# Patient Record
Sex: Male | Born: 1985 | Race: White | Hispanic: Yes | Marital: Married | Smoking: Never smoker
Health system: Southern US, Community
[De-identification: ages and names within clinical notes are randomized; demographics above are authoritative.]

---

## 2007-06-26 ENCOUNTER — Emergency Department (HOSPITAL_COMMUNITY): Admission: EM | Admit: 2007-06-26 | Discharge: 2007-06-26 | Payer: Self-pay | Admitting: Emergency Medicine

## 2010-10-19 LAB — URINALYSIS, ROUTINE W REFLEX MICROSCOPIC
Nitrite: NEGATIVE
Protein, ur: NEGATIVE
Specific Gravity, Urine: 1.02
Urobilinogen, UA: 1

## 2012-09-20 ENCOUNTER — Emergency Department (HOSPITAL_COMMUNITY): Payer: No Typology Code available for payment source

## 2012-09-20 ENCOUNTER — Emergency Department (HOSPITAL_COMMUNITY)
Admission: EM | Admit: 2012-09-20 | Discharge: 2012-09-20 | Disposition: A | Discharge status: Home / Self Care | Payer: No Typology Code available for payment source | Attending: Emergency Medicine | Admitting: Emergency Medicine

## 2012-09-20 ENCOUNTER — Encounter (HOSPITAL_COMMUNITY): Payer: Self-pay | Admitting: Emergency Medicine

## 2012-09-20 DIAGNOSIS — Y92410 Unspecified street and highway as the place of occurrence of the external cause: Secondary | ICD-10-CM | POA: Insufficient documentation

## 2012-09-20 DIAGNOSIS — Y9389 Activity, other specified: Secondary | ICD-10-CM | POA: Insufficient documentation

## 2012-09-20 DIAGNOSIS — S298XXA Other specified injuries of thorax, initial encounter: Secondary | ICD-10-CM | POA: Insufficient documentation

## 2012-09-20 MED ORDER — IBUPROFEN 400 MG PO TABS
800.0000 mg | ORAL_TABLET | Freq: Once | ORAL | Status: AC
  Administered 2012-09-20: 800 mg via ORAL
  Filled 2012-09-20: qty 2

## 2012-09-20 MED ORDER — METHOCARBAMOL 500 MG PO TABS: 500.0000 mg | ORAL_TABLET | Freq: Two times a day (BID) | ORAL | Status: AC

## 2012-09-20 MED ORDER — METHOCARBAMOL 500 MG PO TABS
500.0000 mg | ORAL_TABLET | Freq: Once | ORAL | Status: AC
  Administered 2012-09-20: 500 mg via ORAL
  Filled 2012-09-20: qty 1

## 2012-09-20 MED ORDER — IBUPROFEN 800 MG PO TABS: 800.0000 mg | ORAL_TABLET | Freq: Three times a day (TID) | ORAL | Status: AC
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
## 2012-09-20 NOTE — ED Provider Notes (Signed)
Medical screening examination/treatment/procedure(s) were performed by non-physician practitioner and as supervising physician I was immediately available for consultation/collaboration.  Flint Melter, MD 09/20/12 [phone]
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
## 2017-06-17 ENCOUNTER — Encounter (HOSPITAL_COMMUNITY): Payer: Self-pay

## 2017-06-17 ENCOUNTER — Emergency Department (HOSPITAL_COMMUNITY): Payer: Self-pay

## 2017-06-17 ENCOUNTER — Emergency Department (HOSPITAL_COMMUNITY)
Admission: EM | Admit: 2017-06-17 | Discharge: 2017-06-17 | Disposition: A | Discharge status: Home / Self Care | Payer: Self-pay | Attending: Emergency Medicine | Admitting: Emergency Medicine

## 2017-06-17 DIAGNOSIS — R208 Other disturbances of skin sensation: Secondary | ICD-10-CM | POA: Insufficient documentation

## 2017-06-17 DIAGNOSIS — R6889 Other general symptoms and signs: Secondary | ICD-10-CM

## 2017-06-17 DIAGNOSIS — K649 Unspecified hemorrhoids: Secondary | ICD-10-CM | POA: Insufficient documentation

## 2017-06-17 LAB — URINALYSIS, ROUTINE W REFLEX MICROSCOPIC
BILIRUBIN URINE: NEGATIVE
Glucose, UA: NEGATIVE mg/dL
HGB URINE DIPSTICK: NEGATIVE
KETONES UR: NEGATIVE mg/dL
Leukocytes, UA: NEGATIVE
NITRITE: NEGATIVE
PH: 5 (ref 5.0–8.0)
Protein, ur: NEGATIVE mg/dL
Specific Gravity, Urine: 1.026 (ref 1.005–1.030)

## 2017-06-17 LAB — CBC WITH DIFFERENTIAL/PLATELET
BASOS ABS: 0.1 10*3/uL (ref 0.0–0.1)
Basophils Relative: 1 %
EOS PCT: 1 %
Eosinophils Absolute: 0.1 10*3/uL (ref 0.0–0.7)
HCT: 45.3 % (ref 39.0–52.0)
Hemoglobin: 15 g/dL (ref 13.0–17.0)
LYMPHS PCT: 51 %
Lymphs Abs: 4 10*3/uL (ref 0.7–4.0)
MCH: 27.9 pg (ref 26.0–34.0)
MCHC: 33.1 g/dL (ref 30.0–36.0)
MCV: 84.4 fL (ref 78.0–100.0)
MONO ABS: 0.5 10*3/uL (ref 0.1–1.0)
MONOS PCT: 6 %
NEUTROS PCT: 41 %
Neutro Abs: 3.3 10*3/uL (ref 1.7–7.7)
PLATELETS: 269 10*3/uL (ref 150–400)
RBC: 5.37 MIL/uL (ref 4.22–5.81)
RDW: 13.2 % (ref 11.5–15.5)
WBC: 8 10*3/uL (ref 4.0–10.5)

## 2017-06-17 LAB — COMPREHENSIVE METABOLIC PANEL
ALBUMIN: 3.6 g/dL (ref 3.5–5.0)
ALT: 86 U/L — ABNORMAL HIGH (ref 17–63)
ANION GAP: 6 (ref 5–15)
AST: 44 U/L — ABNORMAL HIGH (ref 15–41)
Alkaline Phosphatase: 141 U/L — ABNORMAL HIGH (ref 38–126)
BUN: 7 mg/dL (ref 6–20)
CHLORIDE: 107 mmol/L (ref 101–111)
CO2: 25 mmol/L (ref 22–32)
Calcium: 9 mg/dL (ref 8.9–10.3)
Creatinine, Ser: 0.82 mg/dL (ref 0.61–1.24)
GFR calc non Af Amer: >60 mL/min (ref >60)
GLUCOSE: 108 mg/dL — AB (ref 65–99)
POTASSIUM: 4.3 mmol/L (ref 3.5–5.1)
Sodium: 138 mmol/L (ref 135–145)
Total Bilirubin: 0.6 mg/dL (ref 0.3–1.2)
Total Protein: 6.9 g/dL (ref 6.5–8.1)

## 2017-06-17 LAB — CK: Total CK: 48 U/L — ABNORMAL LOW (ref 49–397)

## 2017-06-17 NOTE — ED Provider Notes (Addendum)
Patient placed in Quick Look pathway, seen and evaluated   Chief Complaint: subjective fever, bodyaches  HPI:   Patient presents today history of tactile fever and generalized body aches.  Also reports decrease in appetite.  He has been taking antipyretics with improvement in symptoms.  Last antipyretic use was 1 hour prior to arrival.  Denies any cough, congestion, vomiting, abdominal pain, dysuria, back pain, headache, sick contacts with similar symptoms, recent travel, tick bites.  ROS: fever  Physical Exam:   Gen: No distress  Neuro: Awake and Alert  Skin: Warm    Focused Exam: RRR, lungs CTAB. No abdominal TTP. No CVA tenderness.   Initiation of care has begun. The patient has been counseled on the process, plan, and necessity for staying for the completion/evaluation, and the remainder of the medical screening examination    Dietrich Pates, PA-C 06/17/17 1749    Rolland Porter, MD 06/27/17 [phone]

## 2017-06-17 NOTE — ED Notes (Signed)
Patient states to me that he didn't have fever.  He as hemorrhoids but didn't tell the triage nurse.  Patient denies fever at home.

## 2017-06-17 NOTE — ED Provider Notes (Signed)
[facility] EMER[facility]ENC[facility] DEPARTMENT Provider Note   CSN: [id number] Arrival date & time: 06/17/17  1617     History   Chief Complaint Chief Complaint  Patient presents with  . Fever  . Hemorrhoids    HPI Reginald Chung is a 32 y.o. male with no significant past medical history who presents today for evaluation of tactile fevers, generalized body aches.  He also reported to detect that he has hemorrhoids but that he did not have a fever at home.  Patient reports that he has had hemorrhoids for the past 3 to 4 weeks and is concerned because they are now starting to bleed.  He has not gotten any medical attention for these or had them in the past.  He denies any recent constipation, diarrhea, or abdominal pain.  He denies dysuria, hematuria, cough, sore throat, ear pain, or recent sick contacts.  He reports that he did get a flu shot.  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  It has been exceptionally warm the past 2 days, and he reports that he does not think he should be drinking as much water as he needs to.  He reports that he is not urinating often and when he does it is yellow.  He reports generally feeling hot and that when he feels hot his body hurts.  He currently denies feeling hot right now.    HPI  History reviewed. No pertinent past medical history.  There are no active problems to display for this patient.   History reviewed. No pertinent surgical history.      Home Medications    Prior to Admission medications   Medication Sig Start Date End Date Taking? Authorizing Provider  ibuprofen (ADVIL,MOTRIN) 800 MG tablet Take 1 tablet (800 mg total) by mouth 3 (three) times daily. 09/20/12   Domenic Moras, PA-C  methocarbamol (ROBAXIN) 500 MG tablet Take 1 tablet (500 mg total) by mouth 2 (two) times daily. 09/20/12   Domenic Moras, PA-C    Family History No family history on file.  Social History Social History    Tobacco Use  . Smoking status: Never Smoker  Substance Use Topics  . Alcohol use: Yes  . Drug use: No     Allergies   Patient has no known allergies.   Review of Systems Review of Systems  Constitutional: Positive for fever (Tactile). Negative for chills.  HENT: Negative for congestion, ear pain, rhinorrhea, sinus pressure, sinus pain, sore throat and trouble swallowing.   Eyes: Negative for pain and visual disturbance.  Respiratory: Negative for cough, chest tightness and shortness of breath.   Cardiovascular: Negative for chest pain and palpitations.  Gastrointestinal: Positive for anal bleeding (Hemorrhoids). Negative for abdominal pain and vomiting.  Genitourinary: Negative for dysuria and hematuria.       Decreased urination.   Musculoskeletal: Positive for myalgias. Negative for arthralgias and back pain.  Skin: Negative for color change and rash.  Neurological: Negative for seizures, syncope and headaches.  All other systems reviewed and are negative.    Physical Exam Updated Vital Signs BP 121/78 (BP Location: Right Arm)   Pulse 70   Temp 98.4 F (36.9 C) (Oral)   Resp 16   SpO2 100%   Physical Exam  Constitutional: He is oriented to person, place, and time. He appears well-developed and well-nourished. No distress.  HENT:  Head: Normocephalic and atraumatic.  Nose: Nose normal.  Mouth/Throat: Oropharynx is  clear and moist.  Eyes: Conjunctivae are normal. Right eye exhibits no discharge. Left eye exhibits no discharge. No scleral icterus.  Neck: Normal range of motion. Neck supple.  Cardiovascular: Normal rate, regular rhythm, normal heart sounds and intact distal pulses.  Pulmonary/Chest: Effort normal and breath sounds normal. No stridor. No respiratory distress.  Abdominal: Soft. Bowel sounds are normal. He exhibits no distension. There is no tenderness. There is no guarding.  Genitourinary:  Genitourinary Comments: Exam performed with chaperone in  room of external rectum revealing 2 hemorrhoids, they are non-thrombosed, not actively bleeding, DRE was not performed.  Musculoskeletal: He exhibits no edema or deformity.  No TTP over large muscle groups.   Lymphadenopathy:    He has no cervical adenopathy.  Neurological: He is alert and oriented to person, place, and time. He exhibits normal muscle tone.  Skin: Skin is warm and dry. He is not diaphoretic.  Psychiatric: He has a normal mood and affect. His behavior is normal.  Nursing note and vitals reviewed.    ED Treatments / Results  Labs (all labs ordered are listed, but only abnormal results are displayed) Labs Reviewed  COMPREHENSIVE METABOLIC PANEL - Abnormal; Notable for the following components:      Result Value   Glucose, Bld 108 (*)    AST 44 (*)    ALT 86 (*)    Alkaline Phosphatase 141 (*)    All other components within normal limits  CK - Abnormal; Notable for the following components:   Total CK 48 (*)    All other components within normal limits  CBC WITH DIFFERENTIAL/PLATELET  URINALYSIS, ROUTINE W REFLEX MICROSCOPIC  HEPATITIS PANEL, ACUTE    EKG None  Radiology Dg Chest 2 View  Result Date: 06/17/2017 CLINICAL DATA:  Fevers, body aches EXAM: CHEST - 2 VIEW COMPARISON:  09/20/2012 FINDINGS: The heart size and mediastinal contours are within normal limits. Both lungs are clear. The visualized skeletal structures are unremarkable. IMPRESSION: No active cardiopulmonary disease. Electronically Signed   By: Kathreen Devoid   On: 06/17/2017 17:05    Procedures Procedures (including critical care time)  Medications Ordered in ED Medications - No data to display   Initial Impression / Assessment and Plan / ED Course  I have reviewed the triage vital signs and the nursing notes.  Pertinent labs & imaging results that were available during my care of the patient were reviewed by me and considered in my medical decision making (see chart for details).     Patient presents today for evaluation of the sensation of feeling hot.  He reports that this is been going on for 2 days and is associated with generalized muscle aches.  Patient is afebrile here, not tachycardic, with normal vitals.  He does report decreased urine output, as it is hot and he works outside, concerns for dehydration.  Given this labs were obtained, CBC without significant abnormalities, CMP with elevated alk phos, ALT, AST.  Acute hepatitis panel was sent, patient informed that he needs to follow-up with [place] on my chart and states his understanding.  He does not have any abdominal pain or tenderness to palpation, therefore I am not suspicious for cholecystitis, biliary stones, or cholangitis.  CK was not elevated.  Chest x-ray was obtained prior to my evaluation which was negative for acute processes.  Regarding his hemorrhoids he has 2 obvious external hemorrhoids without obvious bleeding, they are not thrombosed.  Preparation H and MiraLAX recommended, along with basic conservative care.  PCP follow-up.  Patient was given strict return precautions, states his understanding.   Final Clinical Impressions(s) / ED Diagnoses   Final diagnoses:  Hemorrhoids, unspecified hemorrhoid type  Sensation of feeling hot    ED Discharge Orders    None       Ollen Gross 06/17/17 2137    Blanchie Dessert, MD 06/17/17 [phone]

## 2017-06-17 NOTE — ED Notes (Signed)
Added on hepatitis panel to previous collection.

## 2017-06-17 NOTE — Discharge Instructions (Signed)
Please get a thermometer to take your temperature.  If you continue to have fevers then please seek additional medical care.  If you develop pain anywhere you need to be seen again.  Today your liver enzymes were slightly elevated, this can be caused by any number of conditions.  Please avoid alcohol, and if you develop pain on the right upper area of your belly please return to the emergency room.  Please do not take any Tylenol, you may take ibuprofen every 8 hours for your pain as needed.  Please make sure that you are staying well-hydrated.  I have sent additional blood work called an acute hepatitis panel, this is to evaluate for infections and other causes of liver irritation.  Please follow-up on these results in my chart.  Please make sure that you are staying well-hydrated.  Please get MiraLAX from the drugstore.  Please take 1 capful every day, this will help soften your bowel movements and relieve your hemorrhoids.  You may also get Preparation H from the drugstore to help with your hemorrhoids.

## 2017-06-17 NOTE — ED Notes (Signed)
Witnesses rectal exam performed by [facility]

## 2017-06-17 NOTE — ED Triage Notes (Signed)
Pt presents for evaluation of tactile fever and generalized body aches x 2 days. Pt denies other symptoms. Pt reports took motrin 1 hour ago for body aches. Afebrile on arrival.

## 2019-01-05 ENCOUNTER — Other Ambulatory Visit: Payer: Self-pay

## 2019-01-05 ENCOUNTER — Emergency Department (HOSPITAL_COMMUNITY)
Admission: EM | Admit: 2019-01-05 | Discharge: 2019-01-06 | Discharge status: Against Medical Advice | Payer: No Typology Code available for payment source | Attending: Emergency Medicine | Admitting: Emergency Medicine

## 2019-01-05 ENCOUNTER — Encounter (HOSPITAL_COMMUNITY): Payer: Self-pay | Admitting: Emergency Medicine

## 2019-01-05 DIAGNOSIS — Z20822 Contact with and (suspected) exposure to covid-19: Secondary | ICD-10-CM

## 2019-01-05 DIAGNOSIS — J029 Acute pharyngitis, unspecified: Secondary | ICD-10-CM | POA: Diagnosis present

## 2019-01-05 DIAGNOSIS — Z20828 Contact with and (suspected) exposure to other viral communicable diseases: Secondary | ICD-10-CM | POA: Insufficient documentation

## 2019-01-05 LAB — GROUP A STREP BY PCR: Group A Strep by PCR: NOT DETECTED

## 2019-01-05 NOTE — ED Triage Notes (Signed)
Patient reports sore throat with mild swelling /hard to swallow onset 2 days ago , denies fever or cough , airway intact /respirations unlabored .

## 2019-01-06 LAB — SARS CORONAVIRUS 2 (TAT 6-24 HRS): SARS Coronavirus 2: NEGATIVE

## 2019-01-06 MED ORDER — DEXAMETHASONE SODIUM PHOSPHATE 10 MG/ML IJ SOLN
10.0000 mg | Freq: Once | INTRAMUSCULAR | Status: AC
  Administered 2019-01-06: 10 mg via INTRAMUSCULAR
  Filled 2019-01-06: qty 1

## 2019-01-06 MED ORDER — CLINDAMYCIN HCL 300 MG PO CAPS: 300.0000 mg | ORAL_CAPSULE | Freq: Three times a day (TID) | ORAL | 0 refills | Status: AC

## 2019-01-06 MED ORDER — CLINDAMYCIN HCL 150 MG PO CAPS
300.0000 mg | ORAL_CAPSULE | Freq: Once | ORAL | Status: AC
  Administered 2019-01-06: 300 mg via ORAL
  Filled 2019-01-06: qty 2

## 2019-01-06 MED ORDER — KETOROLAC TROMETHAMINE 30 MG/ML IJ SOLN
30.0000 mg | Freq: Once | INTRAMUSCULAR | Status: AC
  Administered 2019-01-06: 12:00:00 30 mg via INTRAMUSCULAR
  Filled 2019-01-06: qty 1

## 2019-01-06 NOTE — ED Notes (Signed)
No answer when taken [email or web address]

## 2019-01-06 NOTE — Discharge Instructions (Addendum)
Given an antibiotic and pain medicine while in the emergency department.  I will send you home with additional antibiotics.  Make sure to drink plenty of liquids.  I have obtained a Covid test however this does take 3 to 4 days to return.  You will be called if this is positive.  You will need to stay out of work until this returns.  If your sore throat does not improve over the next 24 hours please follow-up with ear nose and throat doctor.  Their phone number is listed on your discharge instructions.  You will need to call them to schedule an appointment.  If you have any difficulty swallowing liquids, become short of breath, unilateral neck pain or swelling please seek reevaluation emergency department

## 2019-01-06 NOTE — ED Notes (Signed)
Pt verbalized understanding of discharge instructions. Follow up care and prescriptions reviewed. Pt had no further questions at this time.

## 2019-01-06 NOTE — ED Provider Notes (Signed)
[facility]   CSN: [id number] Arrival date & time: 01/05/19  2310     History Chief Complaint  Patient presents with  . Sore Throat    Reginald Chung is a 33 y.o. male with no significant past medical history who presents for evaluation of sore throat.  Patient states he has had a sore throat since Sunday, 2 days PTA.  Located to bilateral sides of his throat.  States he has pain when he swallows however has been able to tolerate soft foods and liquids.  He did have a Covid positive coworker 1 week ago.  He denies fever, chills, nausea, vomiting, facial swelling, drooling, dysphagia, trismus, chest pain, shortness of breath, cough, abdominal pain, diarrhea, dysuria.  Patient denies prior history of strep infections or peritonsillar abscesses.  Denies additional aggravating or alleviating factors.  Rates his pain a 6/10.  History obtained from patient and past medical records.  No interpreter is used.  HPI     History reviewed. No pertinent past medical history.  There are no problems to display for this patient.   History reviewed. No pertinent surgical history.     No family history on file.  Social History   Tobacco Use  . Smoking status: Never Smoker  . Smokeless tobacco: Never Used  Substance Use Topics  . Alcohol use: Yes  . Drug use: No    Home Medications Prior to Admission medications   Medication Sig Start Date End Date Taking? Authorizing Provider  ibuprofen (ADVIL) 200 MG tablet Take 400 mg by mouth every 6 (six) hours as needed for mild pain.   Yes [provider]  ibuprofen (ADVIL,MOTRIN) 800 MG tablet Take 1 tablet (800 mg total) by mouth 3 (three) times daily. Patient not taking: Reported on 01/06/2019 09/20/12   Fayrene Helperran, [place], PA-C  methocarbamol (ROBAXIN) 500 MG tablet Take 1 tablet (500 mg total) by mouth 2 (two) times daily. Patient not taking: Reported on 01/06/2019 09/20/12   Fayrene Helperran, [place],  PA-C    Allergies    Patient has no known allergies.  Review of Systems   Review of Systems  Constitutional: Negative.   HENT: Positive for sore throat. Negative for congestion, ear discharge, ear pain, facial swelling, mouth sores, nosebleeds, postnasal drip, rhinorrhea, sinus pressure, sinus pain and voice change. Trouble swallowing: Pain with swallowing.   Respiratory: Negative.   Cardiovascular: Negative.   Gastrointestinal: Negative.   Genitourinary: Negative.   Musculoskeletal: Negative.   Skin: Negative.   Neurological: Negative.   All other systems reviewed and are negative.   Physical Exam Updated Vital Signs BP 98/88 (BP Location: Right Arm)   Pulse 79   Temp 98.3 F (36.8 C) (Oral)   Resp 16   SpO2 99%   Physical Exam Vitals and nursing note reviewed.  Constitutional:      General: He is not in acute distress.    Appearance: He is well-developed. He is not ill-appearing, toxic-appearing or diaphoretic.  HENT:     Head: Normocephalic and atraumatic.     Jaw: There is normal jaw occlusion.     Right Ear: Tympanic membrane, ear canal and external ear normal. No drainage, swelling or tenderness. Tympanic membrane is not injected, scarred, perforated, erythematous, retracted or bulging.     Left Ear: Tympanic membrane, ear canal and external ear normal. No drainage, swelling or tenderness. Tympanic membrane is not injected, scarred, perforated, erythematous, retracted or bulging.     Mouth/Throat:  Lips: Pink.     Mouth: Mucous membranes are moist.     Tongue: No lesions.     Pharynx: Oropharynx is clear. Uvula midline.     Tonsils: Tonsillar exudate present. No tonsillar abscesses. 2+ on the right. 2+ on the left.     Comments: Tonsils 2+ bilaterally with exudate.  Uvula midline without deviation.  Sublingual area soft.  No drooling, dysphagia or trismus.  No evidence of PTA or RPA.  Mucous membranes moist.  No oral lesions or lacerations.  Tongue midline.  No  facial swelling, submandibular swelling, no evidence of Ludwg's angina Eyes:     Pupils: Pupils are equal, round, and reactive to light.  Neck:     Trachea: Trachea and phonation normal.     Comments: Posterior cervical lymphadenopathy bilaterally.  No neck stiffness or neck rigidity.  No meningismus.  No obvious swelling. Cardiovascular:     Rate and Rhythm: Normal rate and regular rhythm.     Pulses: Normal pulses.     Heart sounds: Normal heart sounds.  Pulmonary:     Effort: Pulmonary effort is normal. No respiratory distress.     Breath sounds: Normal breath sounds and air entry.     Comments: Clear to auscultation bilateral without wheeze, rhonchi or rales.  Speaks in full sentences without difficulty. Abdominal:     General: There is no distension.     Palpations: Abdomen is soft.  Musculoskeletal:        General: Normal range of motion.     Cervical back: Full passive range of motion without pain, normal range of motion and neck supple.  Lymphadenopathy:     Cervical: Cervical adenopathy present.     Right cervical: Posterior cervical adenopathy present.     Left cervical: Posterior cervical adenopathy present.  Skin:    General: Skin is warm and dry.     Comments: No facial swelling, redness or warmth.  Neurological:     Mental Status: He is alert.     ED Results / Procedures / Treatments   Labs (all labs ordered are listed, but only abnormal results are displayed) Labs Reviewed  GROUP A STREP BY PCR  SARS CORONAVIRUS 2 (TAT 6-24 HRS)    EKG None  Radiology No results found.  Procedures Procedures (including critical care time)  Medications Ordered in ED Medications  ketorolac (TORADOL) 30 MG/ML injection 30 mg (30 mg Intramuscular Given 01/06/19 1145)  dexamethasone (DECADRON) injection 10 mg (10 mg Intramuscular Given 01/06/19 1147)  clindamycin (CLEOCIN) capsule 300 mg (300 mg Oral Given 01/06/19 1143)    ED Course  I have reviewed the triage vital  signs and the nursing notes.  Pertinent labs & imaging results that were available during my care of the patient were reviewed by me and considered in my medical decision making (see chart for details).  33 year old presents for evaluation of sore throat x2 days.  He is afebrile, nonseptic, not ill-appearing.  Did have Covid exposure at work.  No upper respiratory symptoms.  Lungs clear to auscultation.  Speaks in full sentences without difficulty.  No neck stiffness or neck rigidity.  He does have posterior cervical lymphadenopathy.  No drooling, dysphagia or trismus.  No facial swelling, no submandibular swelling, evidence of Ludwig's angina, facial cellulitis.  Patient with 2+ bilateral tonsils with exudate.  Uvula midline without deviation.  Sublingual area soft.  Tongue midline.  No evidence of PTA or RPA.  Painful to swallow however he  is able to tolerate soft foods and liquids without difficulty.  Strep test obtained from triage which was negative however I have high clinical suspicion patient has either strep pharyngitis or tonsillitis.  Will treat with NSAIDs, steroids for pain management.  Will also give antibiotics. Tolerating PO intake without difficulty. Patient to follow-up with ENT if his symptoms do not improve. Specific return precautions discussed.  Given he did have positive Covid exposure at work his employer is requesting he obtain testing prior to return to work.  Will obtain outpatient Covid test however given lack of additional symptoms I have low suspicion for this.  Discussed return precautions.  Patient voiced understanding and is agreeable for follow-up.    The patient has been appropriately medically screened and/or stabilized in the ED. I have low suspicion for any other emergent medical condition which would require further screening, evaluation or treatment in the ED or require inpatient management.  Patient is hemodynamically stable and in no acute distress.  Patient able to  ambulate in department prior to ED.  Evaluation does not show acute pathology that would require ongoing or additional emergent interventions while in the emergency department or further inpatient treatment.  I have discussed the diagnosis with the patient and answered all questions.  Pain is been managed while in the emergency department and patient has no further complaints prior to discharge.  Patient is comfortable with plan discussed in room and is stable for discharge at this time.  I have discussed strict return precautions for returning to the emergency department.  Patient was encouraged to follow-up with PCP/specialist refer to at discharge.   Reginald Chung was evaluated in Emergency Department on 01/06/2019 for the symptoms described in the history of present illness. He was evaluated in the context of the global COVID-19 pandemic, which necessitated consideration that the patient might be at risk for infection with the SARS-CoV-2 virus that causes COVID-19. Institutional protocols and algorithms that pertain to the evaluation of patients at risk for COVID-19 are in a state of rapid change based on information released by regulatory bodies including the CDC and federal and state organizations. These policies and algorithms were followed during the patient's care in the ED..     MDM Rules/Calculators/A&P                       Final Clinical Impression(s) / ED Diagnoses Final diagnoses:  Pharyngitis, unspecified etiology  COVID-19 virus test result unknown    Rx / DC Orders ED Discharge Orders    None       Jabrea Kallstrom A, PA-C 01/06/19 1239    Terrilee Files, MD 01/06/19 1718

## 2019-10-20 IMAGING — DX DG CHEST 2V
2 series · 2 of 2 positions shown · non-contrast
Comparison: 09/20/2012

CLINICAL DATA: Fevers, body aches

EXAM:
CHEST - 2 VIEW

[chest pa]
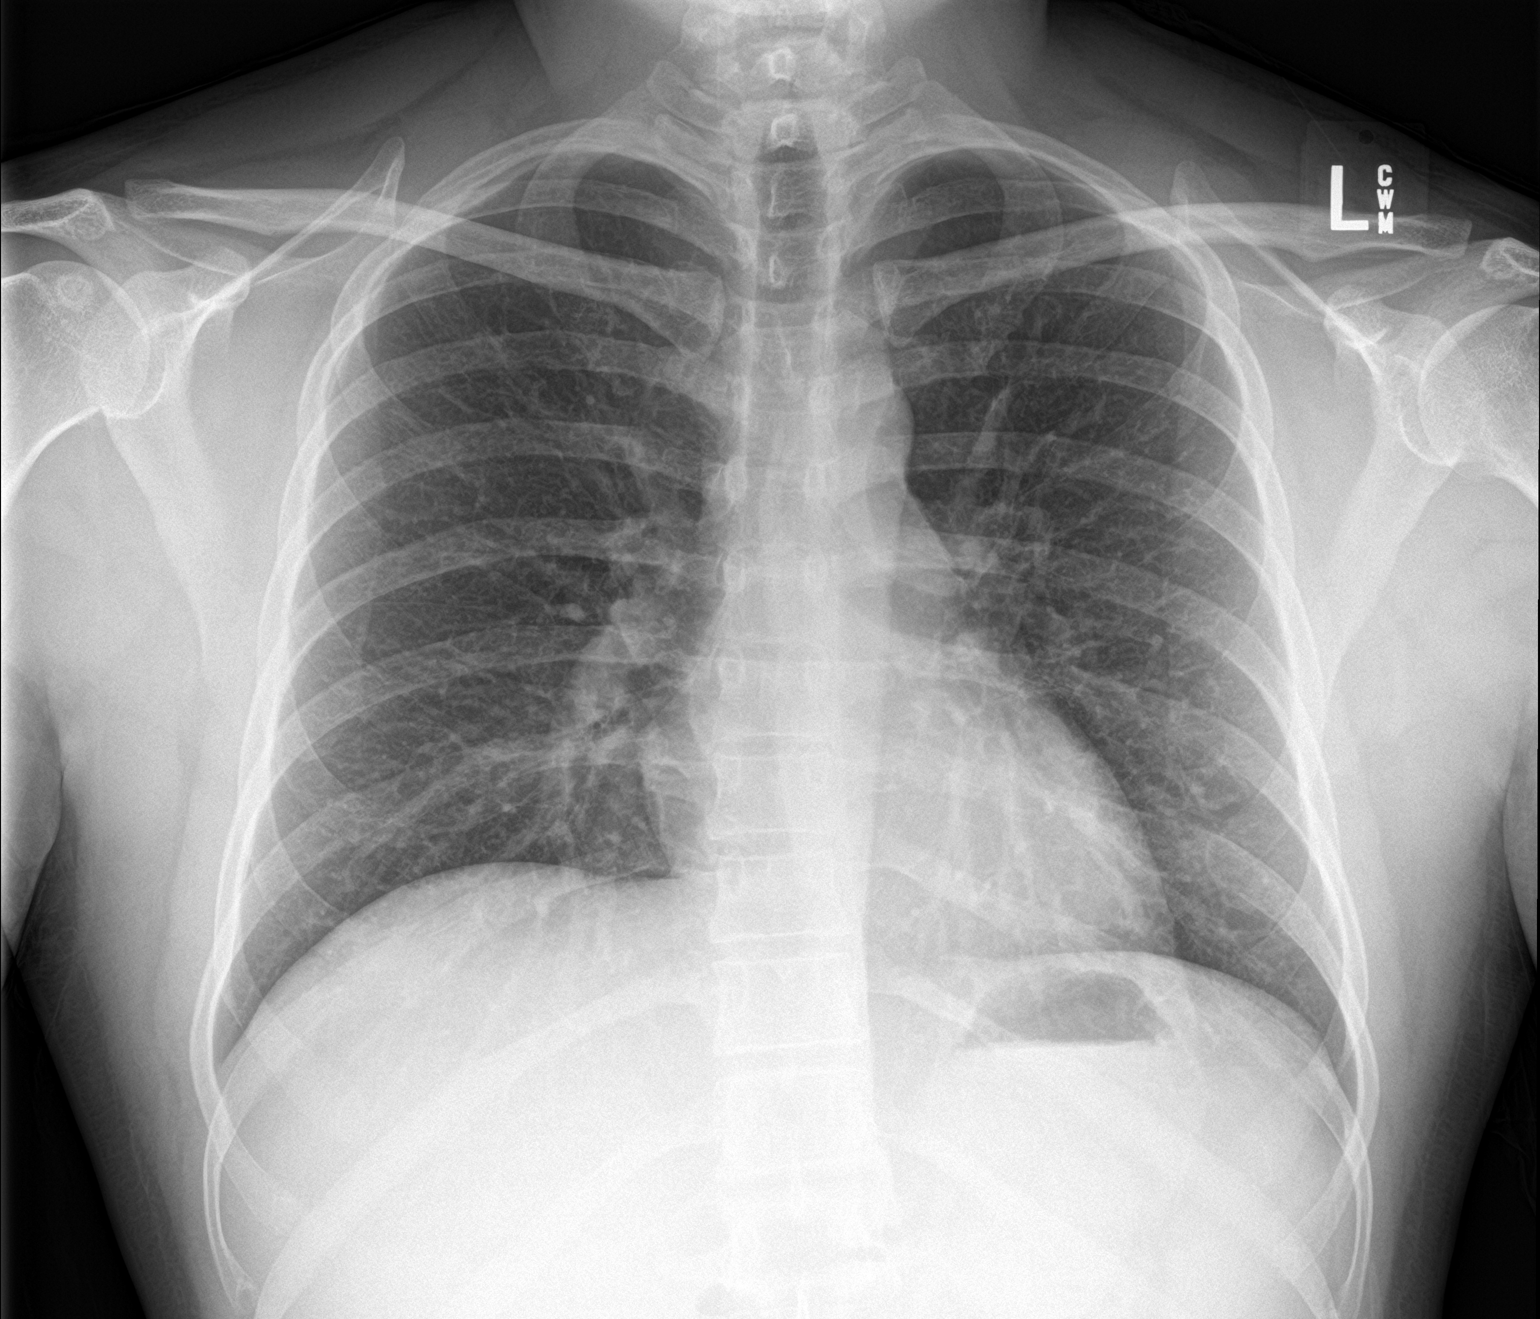

[chest lat]
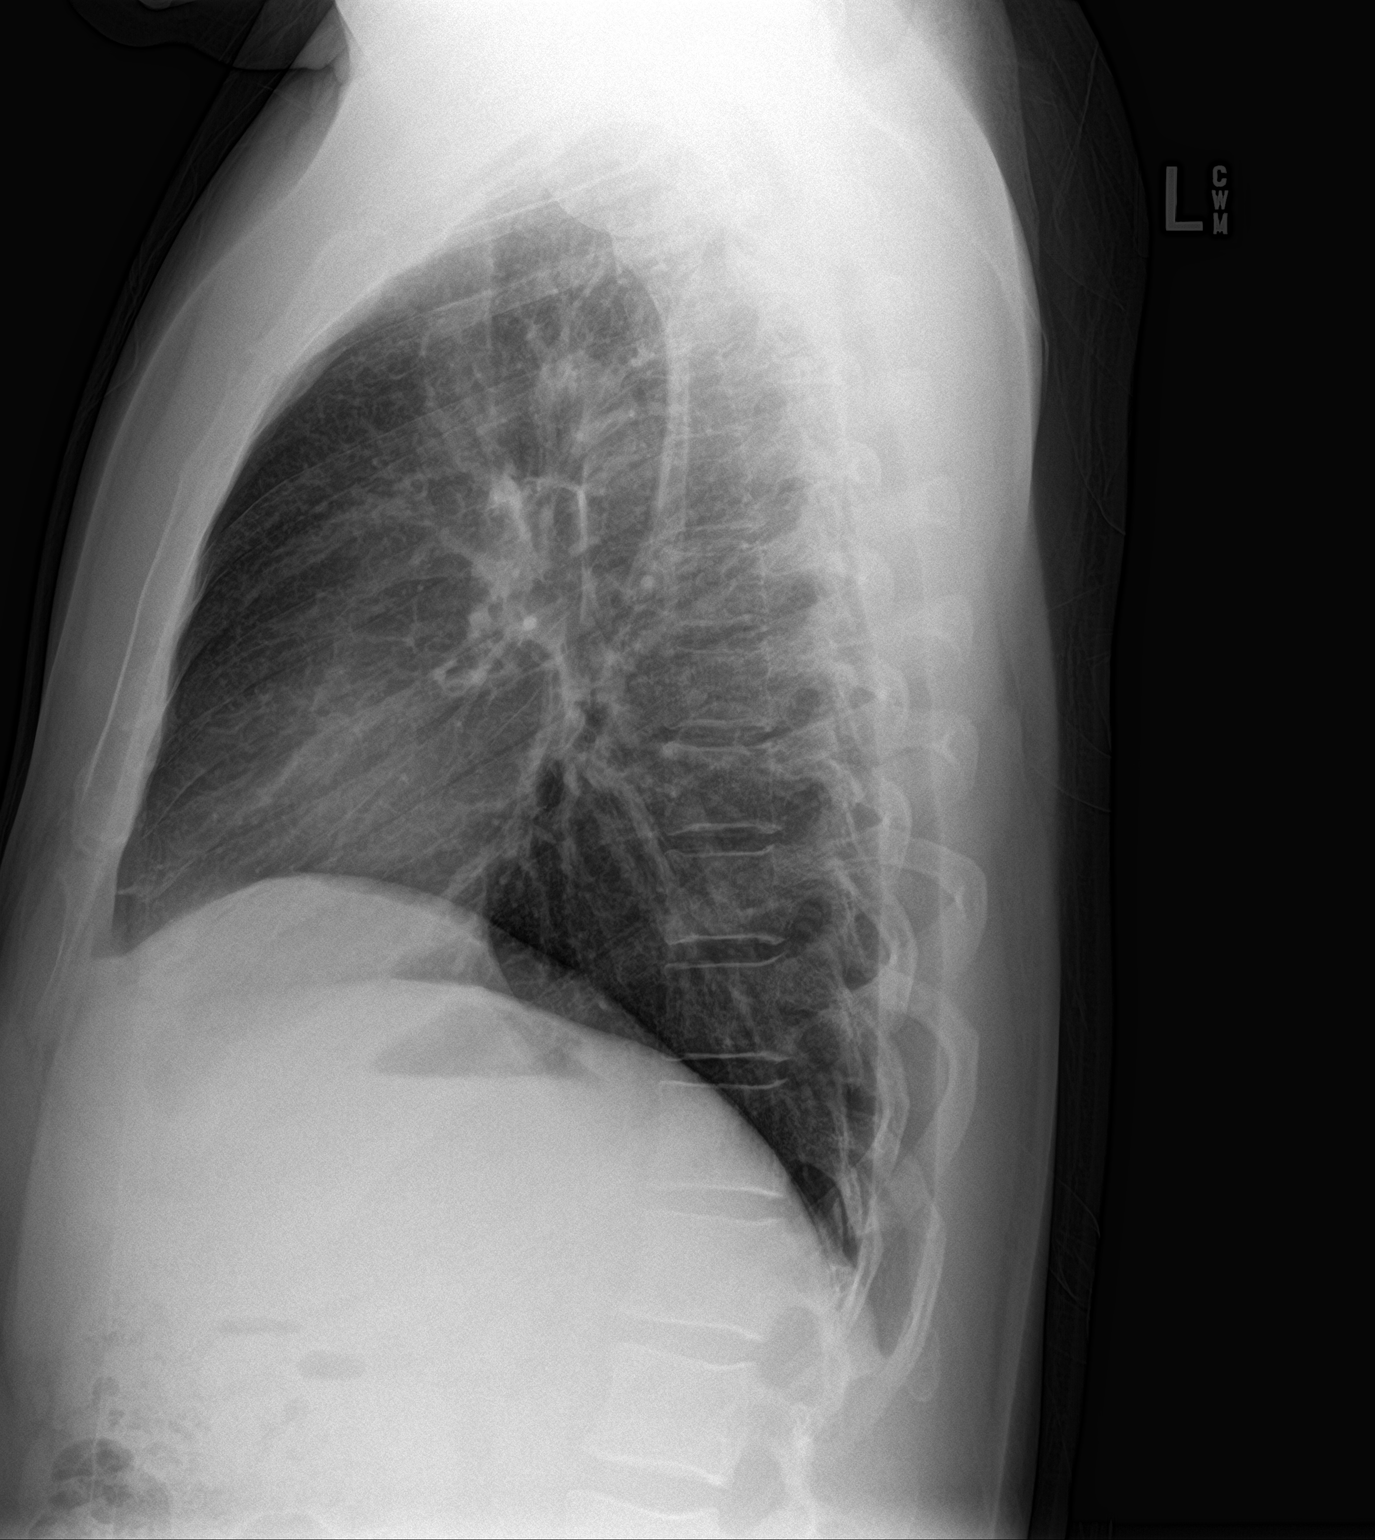

[2 of 2 positions shown; findings below may reference images not displayed]

FINDINGS: The heart size and mediastinal contours are within normal limits.
Both lungs are clear. The visualized skeletal structures are
unremarkable.
IMPRESSION: No active cardiopulmonary disease.
